# Patient Record
Sex: Male | Born: 1980 | Race: White | Hispanic: No | Marital: Single | Smoking: Current every day smoker
Health system: Southern US, Community
[De-identification: ages and names within clinical notes are randomized; demographics above are authoritative.]

---

## 2008-01-07 ENCOUNTER — Emergency Department: Payer: Self-pay | Admitting: Emergency Medicine

## 2008-01-15 ENCOUNTER — Emergency Department: Payer: Self-pay | Admitting: Emergency Medicine

## 2008-02-03 ENCOUNTER — Emergency Department: Payer: Self-pay | Admitting: Emergency Medicine
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
## 2017-06-05 NOTE — Progress Notes (Signed)
 Dermatology New Patient Note  Assessment and Plan:    Folliculitis - clindamycin lotion  - minocycline (MINOCIN,DYNACIN) 100 MG capsule; Take TWICE DAILY for 7 days, then once a day until pills are finished.  Discussed risk of GI upset and photosensitivity.  Hand eczema -Discussed avoiding triggers and minimizing how often hands are in water. We'll use the clobetasol with flares and can use bland emollients was not flared.    - clobetasol (TEMOVATE) 0.05 % ointment; Apply topically Two (2) times a day.  He can do this under occlusion at night -Discussed appropriate use of topical steroids.   Seborrheic dermatitis; mild - ketoconazole (NIZORAL) 2 % shampoo; Apply topically Two (2) times a week.   RTC: 6 months or sooner as needed.  Chief Complaint   New patient, rash on hand   HPI   This is a pleasant 36 y.o. male, who presents as a new patient to [facility] for evaluation of rash on hands.  Notes many year history of itchy rash on both hands that comes up a small blisters on the lateral fingers. Waxes and wanes in intensity.  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  Used a prescription cream that he can't remember in the past which did help. Cortisone and other over-the-counter creams have not helped.  He also has some moles which have been stable in size on his back and neck that he would like to get checked out.  Also notes some pus bumps on chest and back which have flared recently.  Also notes some mild redness and flaking and scalp and beard area  Past Medical History  No history of skin cancer.  Family History: Negative for melanoma.  Social History: lives locally works as curator.   ROS: No fevers, chills, or recent illnesses. No other skin complaints except noted per HPI.   Physical Examination  Gen: Well-developed, well-nourished male, in no acute distress.  Neuro: Alert and oriented, answers questions appropriately. Skin:  Examination of the  face, neck, chest, back, abdomen, bilateral upper and lower extremities, palms, nails was performed and notable for:  Multiple small to medium-sized, well-demarcated, symmetric, uniformly pigmented papules with regular borders scattered over trunk, extremities, and face Scattered inflammatory papules and pustules on back and upper chest  Hands show erythematous plaques on the palms and lateral digits associated with pinpoint vesicles and mild fissuring.

## 2018-08-18 NOTE — Progress Notes (Signed)
" °  Assessment/Plan:      Diagnoses and all orders for this visit:  Tongue ulcer   [Clarification:   Other Orders above = previous meds, which should be continued.]    Seems viral. Fluids, rest,. Lozengers. F/u if worsening   HOPI:    Patient ID:  John Rasmussen is a 37 y.o. male.worried about thrush. Has never had before.  2 days of soreness on sides of tongue. Some sores on tongue on left side. . Mouth dry. Some burning on tongue. Not really a sore throat. No fever.  No recent uri. No recent abx. Works 80 hrs a week. Sleeps 5 hrs a night. Has a new baby. No wght loss. No meds.   No DM, no steroid use. He is an ex smoker.     Past Medical/Surgical History: No past medical history on file. No past surgical history on file.  Allergies: He has No Known Allergies.  Current Medications: Current Outpatient Medications  Medication Sig Dispense Refill   clobetasol (TEMOVATE) 0.05 % ointment Apply topically Two (2) times a day. (Patient not taking: Reported on 08/18/2018) 60 g 5   minocycline (MINOCIN,DYNACIN) 100 MG capsule Take TWICE DAILY for 7 days, then once a day until pills are finished (Patient not taking: Reported on 08/18/2018) 30 capsule 0   No current facility-administered medications for this visit.              Physical Exam:   BP 122/80   Pulse 72   Temp 36.5 C (97.7 F) (Oral)   Resp 14   Ht 179.5 cm (5' 10.67)   Wt 78.6 kg (173 lb 3.2 oz)   SpO2 97%   BMI 24.38 kg/m  General appearance: alert and no distress Eyes: negative findings: conjunctivae and sclerae normal Ears: normal TM's and external ear canals both ears Throat: normal findings: soft palate, uvula, and tonsils normal, Tongue with some erythema on sides and one small ulcer on left side where it is most sore. No whiteness on cheeks or gums, no erythema except around ukcer Neck: no adenopathy  Labs: No results found for this or any previous visit (from the past 24 hour(s)). "

## 2020-04-19 ENCOUNTER — Ambulatory Visit
Admission: EM | Admit: 2020-04-19 | Discharge: 2020-04-19 | Disposition: A | Discharge status: Home / Self Care | Payer: Federal, State, Local not specified - PPO | Attending: Urgent Care | Admitting: Urgent Care

## 2020-04-19 ENCOUNTER — Encounter: Payer: Self-pay | Admitting: Emergency Medicine

## 2020-04-19 ENCOUNTER — Other Ambulatory Visit: Payer: Self-pay

## 2020-04-19 DIAGNOSIS — H9201 Otalgia, right ear: Secondary | ICD-10-CM

## 2020-04-19 DIAGNOSIS — H6691 Otitis media, unspecified, right ear: Secondary | ICD-10-CM | POA: Diagnosis not present

## 2020-04-19 MED ORDER — NEOMYCIN-POLYMYXIN-HC 3.5-10000-1 OT SUSP: 4.0000 [drp] | Freq: Three times a day (TID) | OTIC | 0 refills | Status: AC

## 2020-04-19 MED ORDER — AMOXICILLIN-POT CLAVULANATE 875-125 MG PO TABS: 1.0000 | ORAL_TABLET | Freq: Two times a day (BID) | ORAL | 0 refills | Status: AC

## 2020-04-19 NOTE — ED Triage Notes (Signed)
Patient c/o right ear pain that started on Saturday. Denies any other symptoms.

## 2020-04-19 NOTE — Discharge Instructions (Addendum)
It was very nice seeing you today in clinic. Thank you for entrusting me with your care.   Keep ear clean and dry. Use antibiotics as prescribed. May use Tylenol and/or Ibuprofen as needed for pain/fever.   Make arrangements to follow up with your regular doctor in 1 week for re-evaluation if not improving. If your symptoms/condition worsens, please seek follow up care either here or in the ER. Please remember, our [facility] providers are "right here with you" when you need [place].   Again, it was my pleasure to take care of you today. Thank you for choosing our clinic. I hope that you start to feel better quickly.   Quentin Mulling, MSN, APRN, FNP-C, CEN Advanced Practice Provider [facility] [place] [facility]

## 2020-04-21 NOTE — ED Provider Notes (Signed)
[place], [place]   Name: John Rasmussen DOB: Oct 14, 1981 MRN: [id number] CSN: [id number] PCP: Patient, No Pcp Per  Arrival date and time:  04/19/20 1238  Chief Complaint:  Otalgia  NOTE: Prior to seeing the patient today, I have reviewed the triage nursing documentation and vital signs. Clinical staff has updated patient's PMH/PSHx, current medication list, and drug allergies/intolerances to ensure comprehensive history available to assist in medical decision making.   History:   HPI: John Rasmussen is a 39 y.o. male who presents today with complaints of pain in his RIGHT ear. Pain began with acute onset 3 days.  He is also complaining of a minor sore throat and his tongue being sore.  Patient states, "my tongue feels like it is busting out of my mouth on the right side".  He denies any associated fevers. Patient has not had any other recent upper respiratory symptoms; no cough, congestion, rhinorrhea, or sneezing. He denies forceful nose blowing. Patient has not appreciated any otorrhea. He advises that his ability to hear from the RIGHT ear has acutely changed with the onset of the pain; describes hearing as being muffled. Patient denies history of recurrent ear infections. He has never had tympanostomy tubes in the past. Patient advising that he has not been swimming in the recent past. Patient denies the use of cotton tip swabs to clean his ears. Patient does not have a history of seasonal allergies. Despite his symptoms, patient has not taken any over the counter interventions to help improve/relieve his reported symptoms at home.  Patient is in the [employer] and reports that he is leaving for [facility] on 04/22/2020 and needs to have this taken care of prior to then because [place]  History reviewed. No pertinent past medical history.  History reviewed. No pertinent surgical history.  History reviewed. No pertinent family history.  Social History   Tobacco Use  . Smoking  status: Current Every Day Smoker  . Smokeless tobacco: Never Used  Substance Use Topics  . Alcohol use: Yes  . Drug use: Never    There are no problems to display for this patient.   Home Medications:    No outpatient medications have been marked as taking for the 04/19/20 encounter [facility] Encounter).    Allergies:   Patient has no known allergies.  Review of Systems (ROS):  Review of systems NEGATIVE unless otherwise noted in narrative H&P section.   Vital Signs: Today's Vitals   04/19/20 1308 04/19/20 1309 04/19/20 1310  BP:   131/82  Pulse:   67  Resp:   18  Temp:   98.2 F (36.8 C)  TempSrc:   Oral  SpO2:   98%  Weight:  171 lb (77.6 kg)   Height:  6' (1.829 m)   PainSc: 7       Physical Exam: Physical Exam  Constitutional: He is oriented to person, place, and time and well-developed, well-nourished, and in no distress.  HENT:  Head: Normocephalic and atraumatic.  Right Ear: There is drainage, swelling and tenderness. Tympanic membrane is erythematous. A middle ear effusion is present.  Left Ear: Tympanic membrane normal.  Nose: Nose normal.  Mouth/Throat: Mucous membranes are normal. No oropharyngeal exudate or posterior oropharyngeal edema.  No evidence of tongue swelling; no dysphagia.  Phonation normal.  Eyes: Pupils are equal, round, and reactive to light.  Cardiovascular: Normal rate, regular rhythm, normal heart sounds and intact distal pulses.  Pulmonary/Chest:  Effort normal and breath sounds normal.  Lymphadenopathy:       Head (right side): Submandibular and posterior auricular adenopathy present.  Neurological: He is alert and oriented to person, place, and time. Gait normal.  Skin: Skin is warm and dry. No rash noted. He is not diaphoretic.  Psychiatric: Mood, memory, affect and judgment normal.  Nursing note and vitals reviewed.   Urgent Care Treatments / Results:   No orders of the defined types were placed in this  encounter.   LABS: PLEASE NOTE: all labs that were ordered this encounter are listed, however only abnormal results are displayed. Labs Reviewed - No data to display  EKG: -None  RADIOLOGY: No results found.  PROCEDURES: Procedures  MEDICATIONS RECEIVED THIS VISIT: Medications - No data to display  PERTINENT CLINICAL COURSE NOTES/UPDATES:   Initial Impression / Assessment and Plan / Urgent Care Course:  Pertinent labs & imaging results that were available during my care of the patient were personally reviewed by me and considered in my medical decision making (see lab/imaging section of note for values and interpretations).  John Rasmussen is a 39 y.o. male who presents to [facility] today with complaints of Otalgia  Patient is well appearing overall in clinic today. He does not appear to be in any acute distress. Presenting symptoms (see HPI) and exam as documented above.  Exam consistent with an acute infection of the RIGHT ear.  Patient appears to have both otitis externa and otitis media.  Treating with a 7-day course of amoxicillin-clavulanate and a 5-day course of topical Cortisporin otic drops.  Patient encouraged to keep ears clean and dry. Reviewed supportive care measures; rest, increased hydration, warm salt water gargles, hard candies/lozenges, and hot tea with honey/lemon to help soothe the throat and reduce irritation. May use Tylenol and/or Ibuprofen as needed for pain/fever.    Discussed follow up with primary care physician in 1 week for re-evaluation. I have reviewed the follow up and strict return precautions for any new or worsening symptoms. Patient is aware of symptoms that would be deemed urgent/emergent, and would thus require further evaluation either here or in the emergency department. At the time of discharge, he verbalized understanding and consent with the discharge plan as it was reviewed with him. All questions were fielded by provider and/or clinic  staff prior to patient discharge.    Final Clinical Impressions / Urgent Care Diagnoses:   Final diagnoses:  Acute infection of right ear  Otalgia of right ear    New Prescriptions:  [place] Controlled Substance Registry consulted? Not Applicable  Meds ordered this encounter  Medications  . amoxicillin-clavulanate (AUGMENTIN) 875-125 MG tablet    Sig: Take 1 tablet by mouth 2 (two) times daily for 7 days.    Dispense:  14 tablet    Refill:  0  . neomycin-polymyxin-hydrocortisone (CORTISPORIN) 3.5-10000-1 OTIC suspension    Sig: Place 4 drops into the right ear 3 (three) times daily.    Dispense:  10 mL    Refill:  0    Recommended Follow up Care:  Patient encouraged to follow up with the following provider within the specified time frame, or sooner as dictated by the severity of his symptoms. As always, he was instructed that for any urgent/emergent care needs, he should seek care either here or in the emergency department for more immediate evaluation.  Follow-up Information    PCP In 1 week.   Why: General reassessment of symptoms if not  improving        NOTE: This note was prepared using Scientist, clinical (histocompatibility and immunogenetics) along with smaller Lobbyist. Despite my best ability to proofread, there is the potential that transcriptional errors may still occur from this process, and are completely unintentional.    Verlee Monte, NP 04/21/20 [phone]

## 2024-11-09 ENCOUNTER — Ambulatory Visit

## 2024-11-09 DIAGNOSIS — B009 Herpesviral infection, unspecified: Secondary | ICD-10-CM

## 2024-11-09 MED ORDER — VALACYCLOVIR HCL 1 G PO TABS: 1000.0000 mg | ORAL_TABLET | Freq: Every day | ORAL | 5 refills | Status: AC

## 2024-11-09 NOTE — Progress Notes (Signed)
 " [facility] [address], [address] [place] [address] Main phone: [phone]  Women's Health Problem Visit   Subjective:  John Rasmussen is a 43 y.o. being seen today for refill on rx of Valtrex .   Chief Complaint  Patient presents with   SEXUALLY TRANSMITTED DISEASE    HPI Patient reports he started having a herpes outbreak yesterday. Presents to clinic for refill.  Was given valtrex  here at the [facility] he reports over a year ago. Has been coming to [facility] since he was a teenager.  Has had HSV for ~14-15 years and reports having scratch test here.  He endorses inflammation on his genitals along with  red and clear clustered bumps.  He reports he's been using OTC cortisone cream for inflammation and itching. PCP provider is in [place], [place] at the [place] [facility].   Does the patient have a current or past history of drug use? No   No components found for: HCV]  Health Maintenance Due  Topic Date Due   HIV Screening  Never done   Hepatitis C Screening  Never done   DTaP/Tdap/Td (1 - Tdap) Never done   Pneumococcal Vaccine (1 of 2 - PCV) Never done   HPV VACCINES (1 - 3-dose SCDM series) Never done   Influenza Vaccine  06/19/2024   COVID-19 Vaccine (2 - 2025-26 season) 07/20/2024    Review of Systems  Constitutional: Negative.   Gastrointestinal: Negative.   Genitourinary: Negative.   Neurological: Negative.     The following portions of the patient's history were reviewed and updated as appropriate: allergies, current medications, past family history, past medical history, past social history, past surgical history and problem list. Problem list updated.  See flowsheet for other program required questions.  Objective:  There were no vitals filed for this visit.  Physical Exam Vitals and nursing note reviewed.  Constitutional:      Appearance: Normal appearance.  HENT:     Head: Normocephalic and  atraumatic.     Comments: No nits or hair loss of scalp, brows, and lashes Eyes:     Conjunctiva/sclera: Conjunctivae normal.     Right eye: Right conjunctiva is not injected. No exudate.    Left eye: Left conjunctiva is not injected. No exudate.    Pupils: Pupils are equal, round, and reactive to light.  Pulmonary:     Effort: Pulmonary effort is normal.  Abdominal:     Palpations: There is no hepatomegaly.  Genitourinary:    Comments: Politely declined genital exam. Lymphadenopathy:     Upper Body:     Right upper body: No supraclavicular, axillary or epitrochlear adenopathy.     Left upper body: No supraclavicular, axillary or epitrochlear adenopathy.     Comments: Patient declines genital exam. Inguinal lymph nodes not assessed.   Neurological:     General: No focal deficit present.     Mental Status: He is alert and oriented to person, place, and time. Mental status is at baseline.  Psychiatric:        Mood and Affect: Mood normal.    Assessment and Plan:  John Rasmussen is a 43 y.o. male presenting to the [facility] Department for a Women's Health problem visit  1. HSV (herpes simplex virus) infection (Primary) -Tx for episodic HSV outbreak sent to pharmacy.  - valACYclovir  (VALTREX ) 1000 MG tablet; Take 1 tablet (1,000 mg total) by mouth daily for 5 days. 1 g once  daily for 5 days for herpes outbreak. Take at the first sign of herpes outbreak.  Dispense: 5 tablet; Refill: 5  Return for STI screening, PRN.  No future appointments.  Hardin GORMAN Pouch, NP "
